# Patient Record
Sex: Female | Born: 1970 | Race: White | Hispanic: No | Smoking: Current some day smoker
Health system: Southern US, Community
[De-identification: ages and names within clinical notes are randomized; demographics above are authoritative.]

---

## 2016-11-26 ENCOUNTER — Other Ambulatory Visit: Payer: Self-pay | Admitting: Internal Medicine

## 2016-11-26 ENCOUNTER — Other Ambulatory Visit: Payer: Self-pay | Admitting: Emergency Medicine

## 2016-11-26 ENCOUNTER — Ambulatory Visit (INDEPENDENT_AMBULATORY_CARE_PROVIDER_SITE_OTHER): Payer: 59 | Admitting: Emergency Medicine

## 2016-11-26 ENCOUNTER — Ambulatory Visit
Admission: RE | Admit: 2016-11-26 | Discharge: 2016-11-26 | Disposition: A | Discharge status: Home / Self Care | Payer: Self-pay | Source: Ambulatory Visit | Attending: Emergency Medicine | Admitting: Emergency Medicine

## 2016-11-26 ENCOUNTER — Encounter: Payer: Self-pay | Admitting: Emergency Medicine

## 2016-11-26 DIAGNOSIS — R911 Solitary pulmonary nodule: Secondary | ICD-10-CM | POA: Insufficient documentation

## 2016-11-26 DIAGNOSIS — J939 Pneumothorax, unspecified: Secondary | ICD-10-CM | POA: Diagnosis not present

## 2016-11-26 NOTE — Assessment & Plan Note (Signed)
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 Neither one required chest tube placement. based on this I discussed possible alpha-1 antitrypsin deficiency with her. Unclear that she needs testing but we can discuss further.

## 2016-11-26 NOTE — Progress Notes (Signed)
Subjective:    Patient ID: Alisha Floyd, female    DOB: 12/12/1973, 46 y.o.   MRN: [id number]  HPI 46 year old woman, history tobacco use (10 pack years), status post left breast lumpectomy in 2010 that was benign, chronic headaches, spontaneous L pneumothoraces 2 (2005). She is here today to discuss an abnormal CT scan of the chest. She was undergoing an evaluation for possible Reynaulds. Part of her evaluation included a d-dimer that was positive. This prompted a CT-PA. It shows a 5 mm right middle lobe nodule of unclear significance. She currently has no pulmonary symptoms.   Review of Systems  Constitutional: Negative for fever and unexpected weight change.  HENT: Negative for congestion, dental problem, ear pain, nosebleeds, postnasal drip, rhinorrhea, sinus pressure, sneezing, sore throat and trouble swallowing.   Eyes: Negative for redness and itching.  Respiratory: Negative for cough, chest tightness, shortness of breath and wheezing.   Cardiovascular: Negative for palpitations and leg swelling.  Gastrointestinal: Negative for nausea and vomiting.  Genitourinary: Negative for dysuria.  Musculoskeletal: Negative for joint swelling.  Skin: Negative for rash.  Neurological: Positive for headaches.  Hematological: Does not bruise/bleed easily.  Psychiatric/Behavioral: Negative for dysphoric mood. The patient is nervous/anxious.    No past medical history on file.   No family history on file.   Social History   Social History  . Marital status: N/A    Spouse name: N/A  . Number of children: N/A  . Years of education: N/A   Occupational History  . Not on file.   Social History Main Topics  . Smoking status: Current Some Day Smoker    Packs/day: 0.50    Years: 20.00    Types: Cigarettes  . Smokeless tobacco: Never Used  . Alcohol use No  . Drug use: No  . Sexual activity: Not on file   Other Topics Concern  . Not on file   Social History Narrative  . No  narrative on file     Allergies no known allergies   No outpatient prescriptions prior to visit.   No facility-administered medications prior to visit.         Objective:   Physical Exam Vitals:   11/26/16 1621  BP: 110/78  Pulse: 70  SpO2: 100%  Weight: 126 lb (57.2 kg)  Height: 5\' 7"  (1.702 m)   Gen: Pleasant, well-nourished, in no distress,  normal affect  ENT: No lesions,  mouth clear,  oropharynx clear, no postnasal drip  Neck: No JVD, no stridor  Lungs: No use of accessory muscles, clear without rales or rhonchi  Cardiovascular: RRR, heart sounds normal, no murmur or gallops, no peripheral edema  Musculoskeletal: No deformities, no cyanosis or clubbing  Neuro: alert, non focal  Skin: Warm, no lesions or rashes     Assessment & Plan:  Solitary pulmonary nodule Small 5 mm right middle lobe nodule of unclear significance. Suspect benign. She is low risk since she has a minimal tobacco history. He is being evaluated for possible autoimmune process, question whether the nodule could be related. I believe she needs a repeat CT scan in 6-12 months. She has elected to have the scan here in [place] in 6 months. 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 Neither one required chest tube placement. based on this I discussed possible alpha-1 antitrypsin deficiency with her. Unclear that she needs testing but we can discuss further.   Levy Pupaobert Tyden Kann, MD,  PhD 11/26/2016, 5:11 PM [facility] [phone] or if no answer [phone]

## 2016-11-26 NOTE — Patient Instructions (Signed)
We will repeat your CT scan of the chest with contrast to evaluate your pulmonary nodule in February 2019. Follow-up with Dr. Delton CoombesByrum after your CT scan is done to review the results together. We briefly discussed a disease called alpha-1 anti-trypsin deficiency. Sometimes this problem can be associated with pneumothoraces. We can discuss whether testing for this problem would be relevant at your future visit. You would benefit from stopping smoking.

## 2016-11-26 NOTE — Assessment & Plan Note (Signed)
Small 5 mm right middle lobe nodule of unclear significance. Suspect benign. She is low risk since she has a minimal tobacco history. He is being evaluated for possible autoimmune process, question whether the nodule could be related. I believe she needs a repeat CT scan in 6-12 months. She has elected to have the scan here in [place] in 6 months. I'll review the results with her

## 2017-03-02 ENCOUNTER — Telehealth: Payer: Self-pay | Admitting: Emergency Medicine

## 2017-03-02 ENCOUNTER — Other Ambulatory Visit: Payer: Self-pay | Admitting: Internal Medicine

## 2017-03-02 ENCOUNTER — Ambulatory Visit
Admission: RE | Admit: 2017-03-02 | Discharge: 2017-03-02 | Disposition: A | Discharge status: Home / Self Care | Payer: Self-pay | Source: Ambulatory Visit | Attending: Emergency Medicine | Admitting: Emergency Medicine

## 2017-03-02 DIAGNOSIS — R911 Solitary pulmonary nodule: Secondary | ICD-10-CM

## 2017-03-02 NOTE — Telephone Encounter (Signed)
Order placed. Called Stacy to let her know and left a detailed message. Nothing further is needed.

## 2017-03-19 ENCOUNTER — Encounter (INDEPENDENT_AMBULATORY_CARE_PROVIDER_SITE_OTHER): Payer: Self-pay

## 2017-03-19 ENCOUNTER — Ambulatory Visit (INDEPENDENT_AMBULATORY_CARE_PROVIDER_SITE_OTHER)
Admission: RE | Admit: 2017-03-19 | Discharge: 2017-03-19 | Disposition: A | Discharge status: Home / Self Care | Payer: 59 | Source: Ambulatory Visit | Attending: Emergency Medicine | Admitting: Emergency Medicine

## 2017-03-19 DIAGNOSIS — R911 Solitary pulmonary nodule: Secondary | ICD-10-CM

## 2017-03-19 MED ORDER — IOPAMIDOL (ISOVUE-300) INJECTION 61%
80.0000 mL | Freq: Once | INTRAVENOUS | Status: AC | PRN
  Administered 2017-03-19: 80 mL via INTRAVENOUS

## 2017-03-30 ENCOUNTER — Telehealth: Payer: Self-pay | Admitting: Emergency Medicine

## 2017-03-30 DIAGNOSIS — R918 Other nonspecific abnormal finding of lung field: Secondary | ICD-10-CM

## 2017-03-30 NOTE — Telephone Encounter (Signed)
RB please advise on patients results of CT scan from 2.8.19

## 2017-03-31 NOTE — Telephone Encounter (Signed)
Attempted to call pt but no answer.   Left message for pt to return our call x1 

## 2017-03-31 NOTE — Telephone Encounter (Signed)
Please let her know that her pulmonary nodule is unchanged, stable on her CT scan of the chest.  This is good news.  She needs to have a repeat scan done in 1 year to make sure that it remains stable.  We can discuss further at her follow-up visit

## 2017-04-01 NOTE — Telephone Encounter (Signed)
Pt aware of results/recs.  Follow up CT chest ordered.  Nothing further needed.

## 2018-02-25 ENCOUNTER — Encounter: Payer: Self-pay | Admitting: Emergency Medicine

## 2018-03-14 ENCOUNTER — Other Ambulatory Visit: Payer: 59

## 2018-03-14 ENCOUNTER — Ambulatory Visit (INDEPENDENT_AMBULATORY_CARE_PROVIDER_SITE_OTHER): Payer: 59

## 2018-03-14 DIAGNOSIS — J439 Emphysema, unspecified: Secondary | ICD-10-CM

## 2018-03-14 DIAGNOSIS — R911 Solitary pulmonary nodule: Secondary | ICD-10-CM | POA: Diagnosis not present

## 2018-03-14 DIAGNOSIS — R918 Other nonspecific abnormal finding of lung field: Secondary | ICD-10-CM

## 2018-03-24 ENCOUNTER — Telehealth: Payer: Self-pay | Admitting: Emergency Medicine

## 2018-03-24 NOTE — Telephone Encounter (Signed)
Please let the patient know that her CT scan of the chest done on 03/14/2018 is stable compared with her outside films dating back to 2018.  She should not need any further follow-up films to follow the nodule.  This is good news

## 2018-03-24 NOTE — Telephone Encounter (Signed)
ATC pt, received a message that my call could not be completed as dialed at this time. Will try back.

## 2018-03-25 NOTE — Telephone Encounter (Signed)
Called patient she is aware of results and nothing further is needed.

## 2019-08-01 IMAGING — CT CT CHEST W/O CM
2 of 3 series · 15 of 36 positions shown, 18 images · non-contrast
Comparison: 03/19/2017 chest CT.

CLINICAL DATA: Follow-up pulmonary nodule.

EXAM:
CT CHEST WITHOUT CONTRAST
TECHNIQUE: Multidetector CT imaging of the chest was performed following the
standard protocol without IV contrast.

[Series 2: thorax · axial · 0.75mm/px · z∈[-316,-36]mm · 12 of 66 slices shown, 15 images]
[im 5/66  mediastinal]
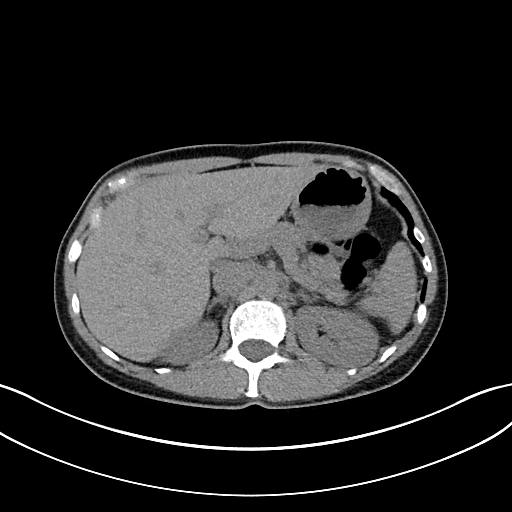
[im 5/66  lung]
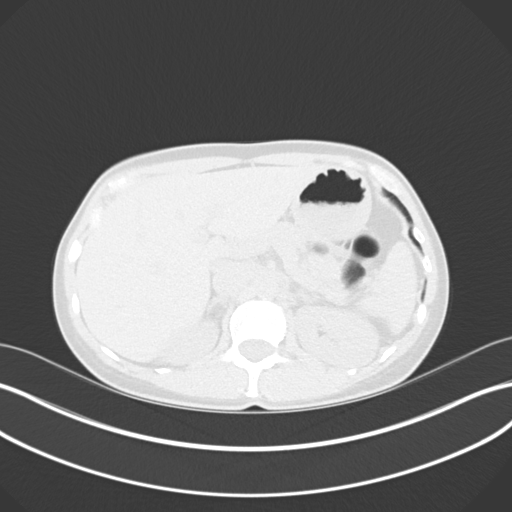
[im 10/66  lung]
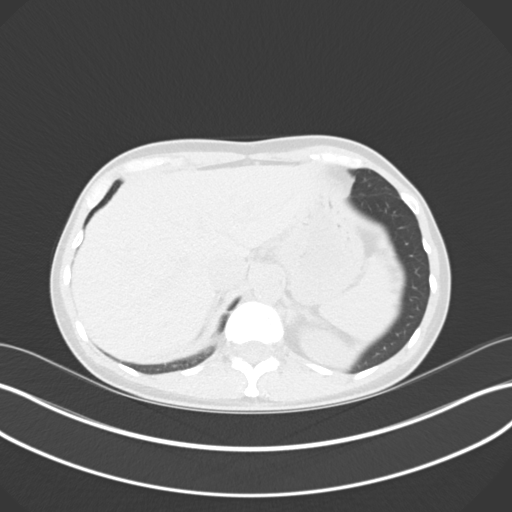
[im 15/66  lung]
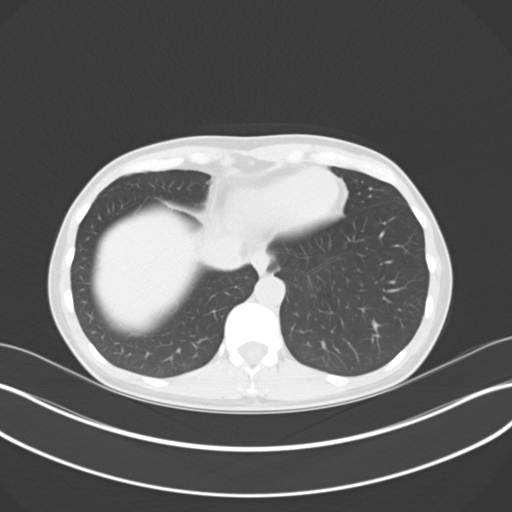
[im 20/66  lung]
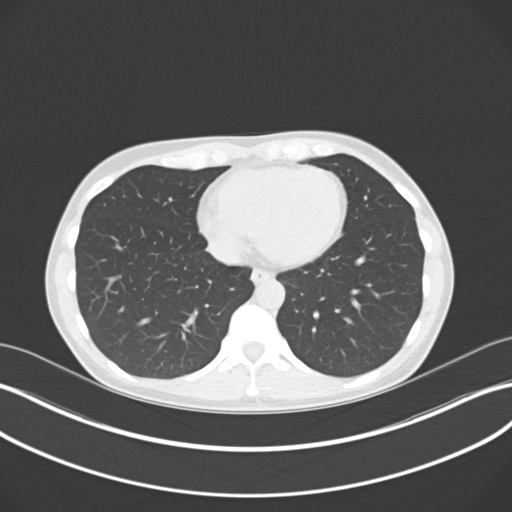
[im 25/66  mediastinal]
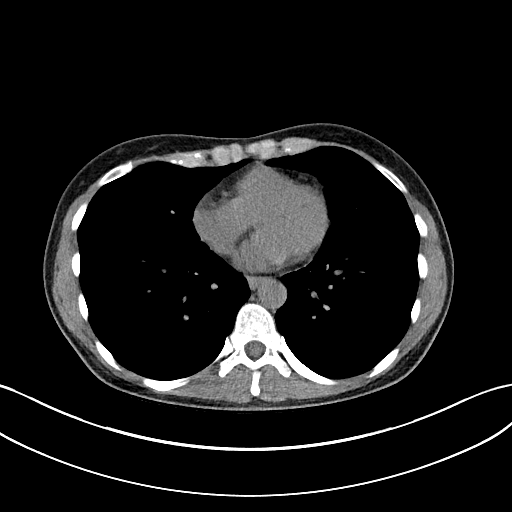
[im 25/66  lung]
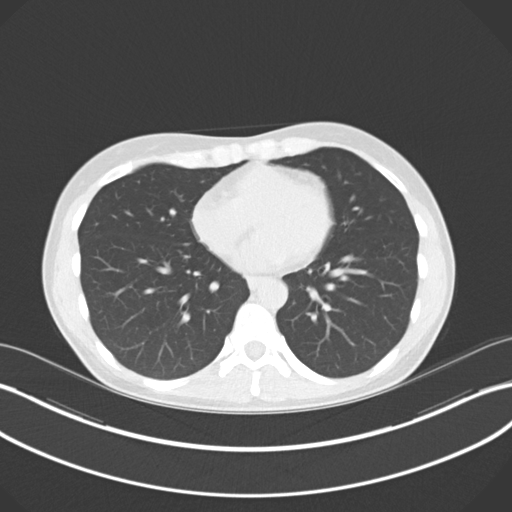
[im 29/66  lung]
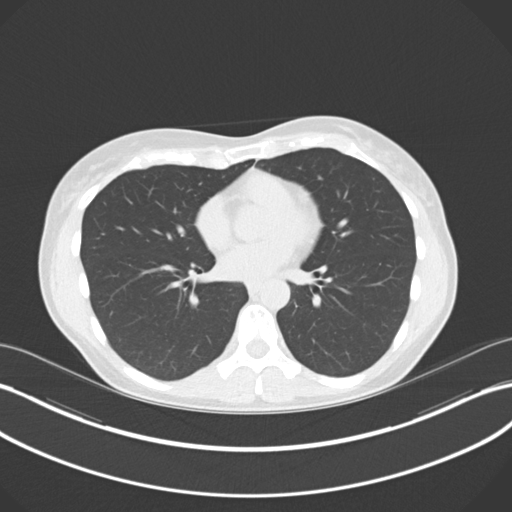
[im 37/66  lung]
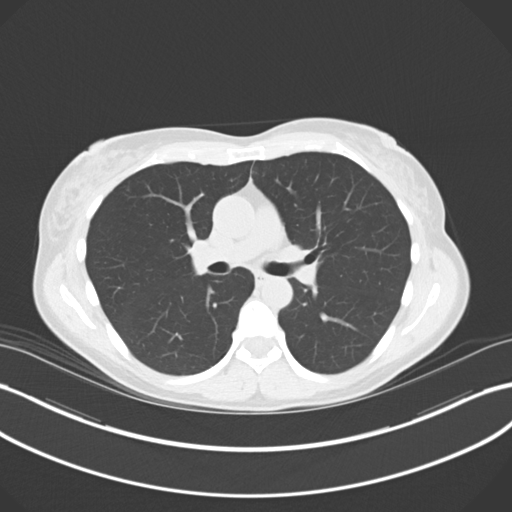
[im 41/66  lung]
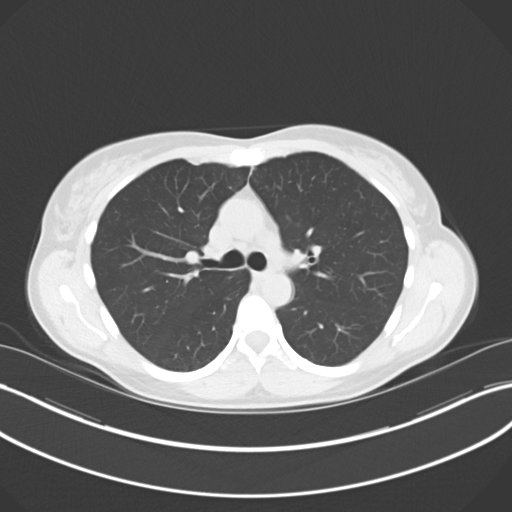
[im 46/66  mediastinal]
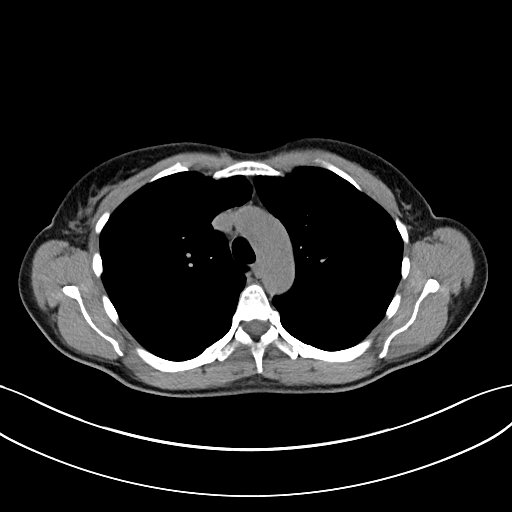
[im 46/66  lung]
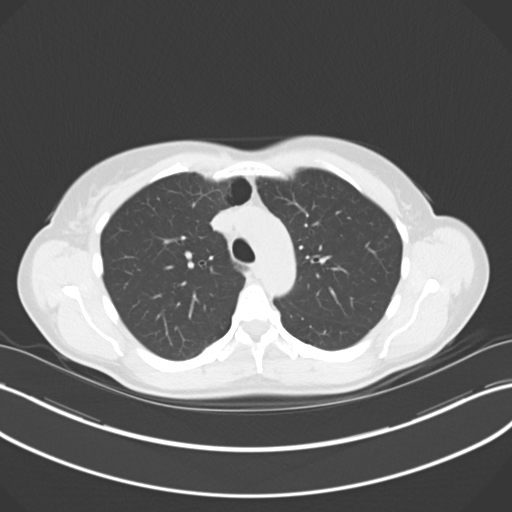
[im 51/66  lung]
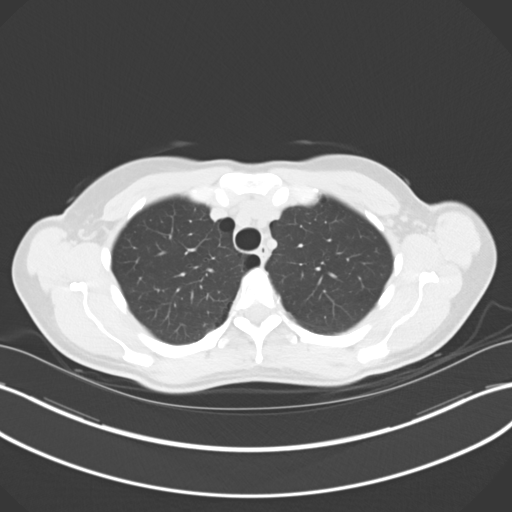
[im 56/66  lung]
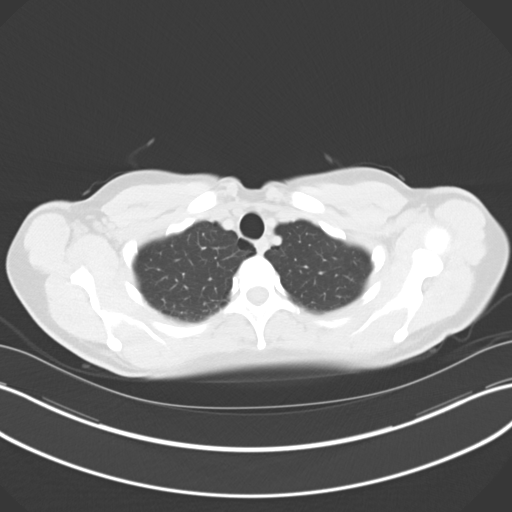
[im 61/66  lung]
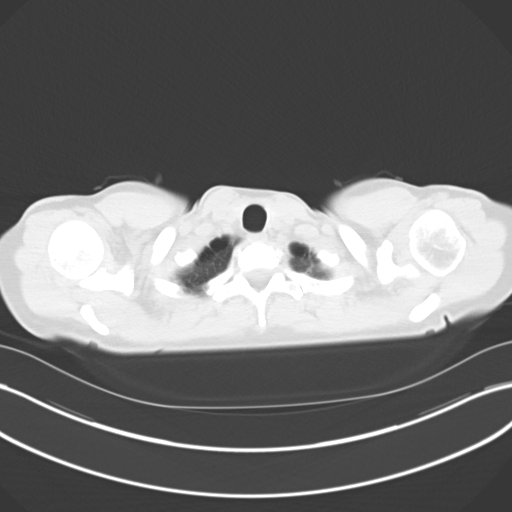

[Series 5: coronal · coronal · 0.65mm/px · 3 of 110 slices shown]
[im 22/110  lung]
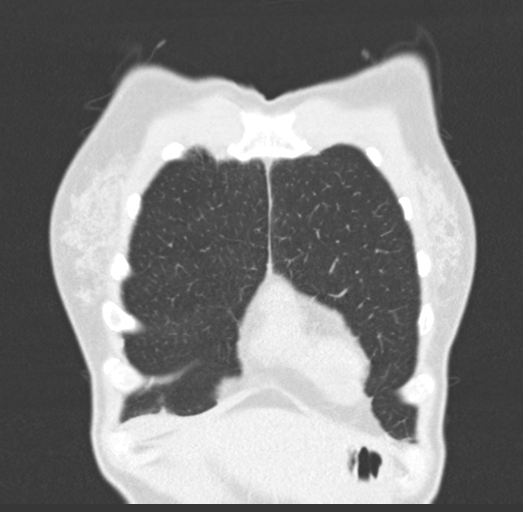
[im 44/110  lung]
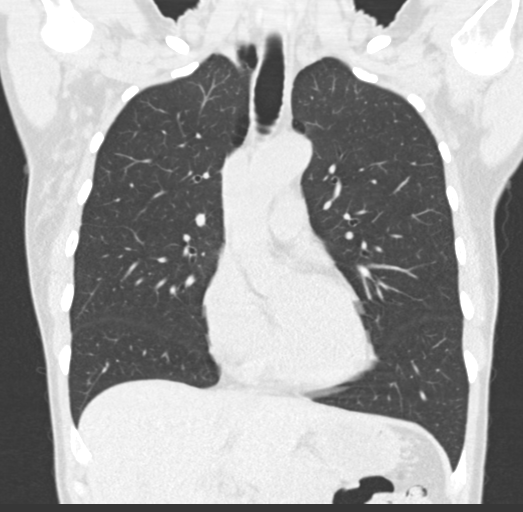
[im 66/110  lung]
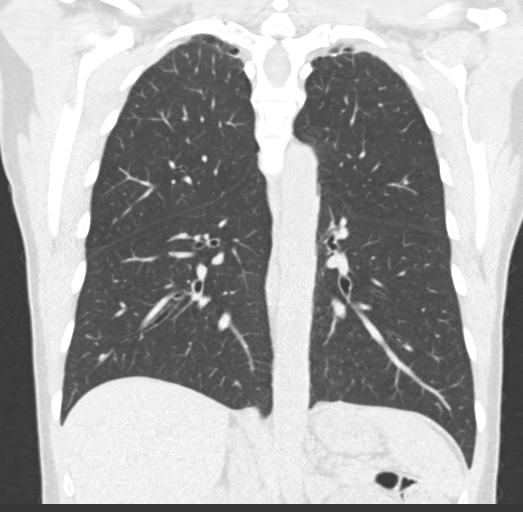

[15 of 36 positions shown; findings below may reference images not displayed]

FINDINGS: Cardiovascular: Normal heart size. No significant pericardial
effusion/thickening. Great vessels are normal in course and caliber.

Mediastinum/Nodes: No discrete thyroid nodules. Unremarkable
esophagus. No pathologically enlarged axillary, mediastinal or hilar
lymph nodes, noting limited sensitivity for the detection of hilar
adenopathy on this noncontrast study.

Lungs/Pleura: No pneumothorax. No pleural effusion. Mild
centrilobular and paraseptal emphysema. No acute consolidative
airspace disease or lung masses. Subpleural 4 mm solid pulmonary
nodule in the anterior right middle lobe (series 3/image 113),
stable since 09/11/2016 outside chest CT, considered benign. No new
significant pulmonary nodules.

Upper abdomen: No acute abnormality.

Musculoskeletal:  No aggressive appearing focal osseous lesions.
IMPRESSION: 1. Subpleural 4 mm right middle lobe solid pulmonary nodule is
stable since 5556 outside chest CT and is considered benign. No
acute pulmonary disease.
2. Mild centrilobular and paraseptal emphysema.

Emphysema ([id number]).
